# Patient Record
Sex: Male | Born: 1980 | Race: White | Hispanic: Yes | Marital: Single | Smoking: Never smoker
Health system: Southern US, Community
[De-identification: ages and names within clinical notes are randomized; demographics above are authoritative.]

## PROBLEM LIST (undated history)

## (undated) DIAGNOSIS — I10 Essential (primary) hypertension: Secondary | ICD-10-CM

---

## 2011-04-01 ENCOUNTER — Emergency Department (HOSPITAL_COMMUNITY)
Admission: EM | Admit: 2011-04-01 | Discharge: 2011-04-01 | Disposition: A | Discharge status: Home / Self Care | Payer: Self-pay | Attending: Emergency Medicine | Admitting: Emergency Medicine

## 2011-04-01 ENCOUNTER — Emergency Department (HOSPITAL_COMMUNITY): Payer: Self-pay

## 2011-04-01 DIAGNOSIS — I10 Essential (primary) hypertension: Secondary | ICD-10-CM | POA: Insufficient documentation

## 2011-04-01 DIAGNOSIS — R0789 Other chest pain: Secondary | ICD-10-CM | POA: Insufficient documentation

## 2011-04-01 LAB — CBC
Hemoglobin: 15.8 g/dL (ref 13.0–17.0)
MCH: 30.2 pg (ref 26.0–34.0)
MCV: 85.9 fL (ref 78.0–100.0)
RBC: 5.23 MIL/uL (ref 4.22–5.81)

## 2011-04-01 LAB — CK TOTAL AND CKMB (NOT AT ARMC): Relative Index: 2.6 — ABNORMAL HIGH (ref 0.0–2.5)

## 2011-04-01 LAB — DIFFERENTIAL
Basophils Absolute: 0.1 10*3/uL (ref 0.0–0.1)
Eosinophils Absolute: 0.1 10*3/uL (ref 0.0–0.7)
Eosinophils Relative: 2 % (ref 0–5)
Lymphocytes Relative: 29 % (ref 12–46)
Monocytes Absolute: 0.4 10*3/uL (ref 0.1–1.0)

## 2011-04-01 LAB — COMPREHENSIVE METABOLIC PANEL
ALT: 18 U/L (ref 0–53)
CO2: 26 mEq/L (ref 19–32)
Calcium: 8.5 mg/dL (ref 8.4–10.5)
GFR calc Af Amer: >60 mL/min (ref >60)
GFR calc non Af Amer: >60 mL/min (ref >60)
Glucose, Bld: 168 mg/dL — ABNORMAL HIGH (ref 70–99)
Sodium: 139 mEq/L (ref 135–145)
Total Bilirubin: 0.2 mg/dL — ABNORMAL LOW (ref 0.3–1.2)

## 2013-02-03 IMAGING — CR DG CHEST 1V PORT
1 series · 1 of 1 positions shown · non-contrast
Comparison: None.

CLINICAL DATA: Chest pain.  Headache.

PORTABLE CHEST - 1 VIEW

[AP]
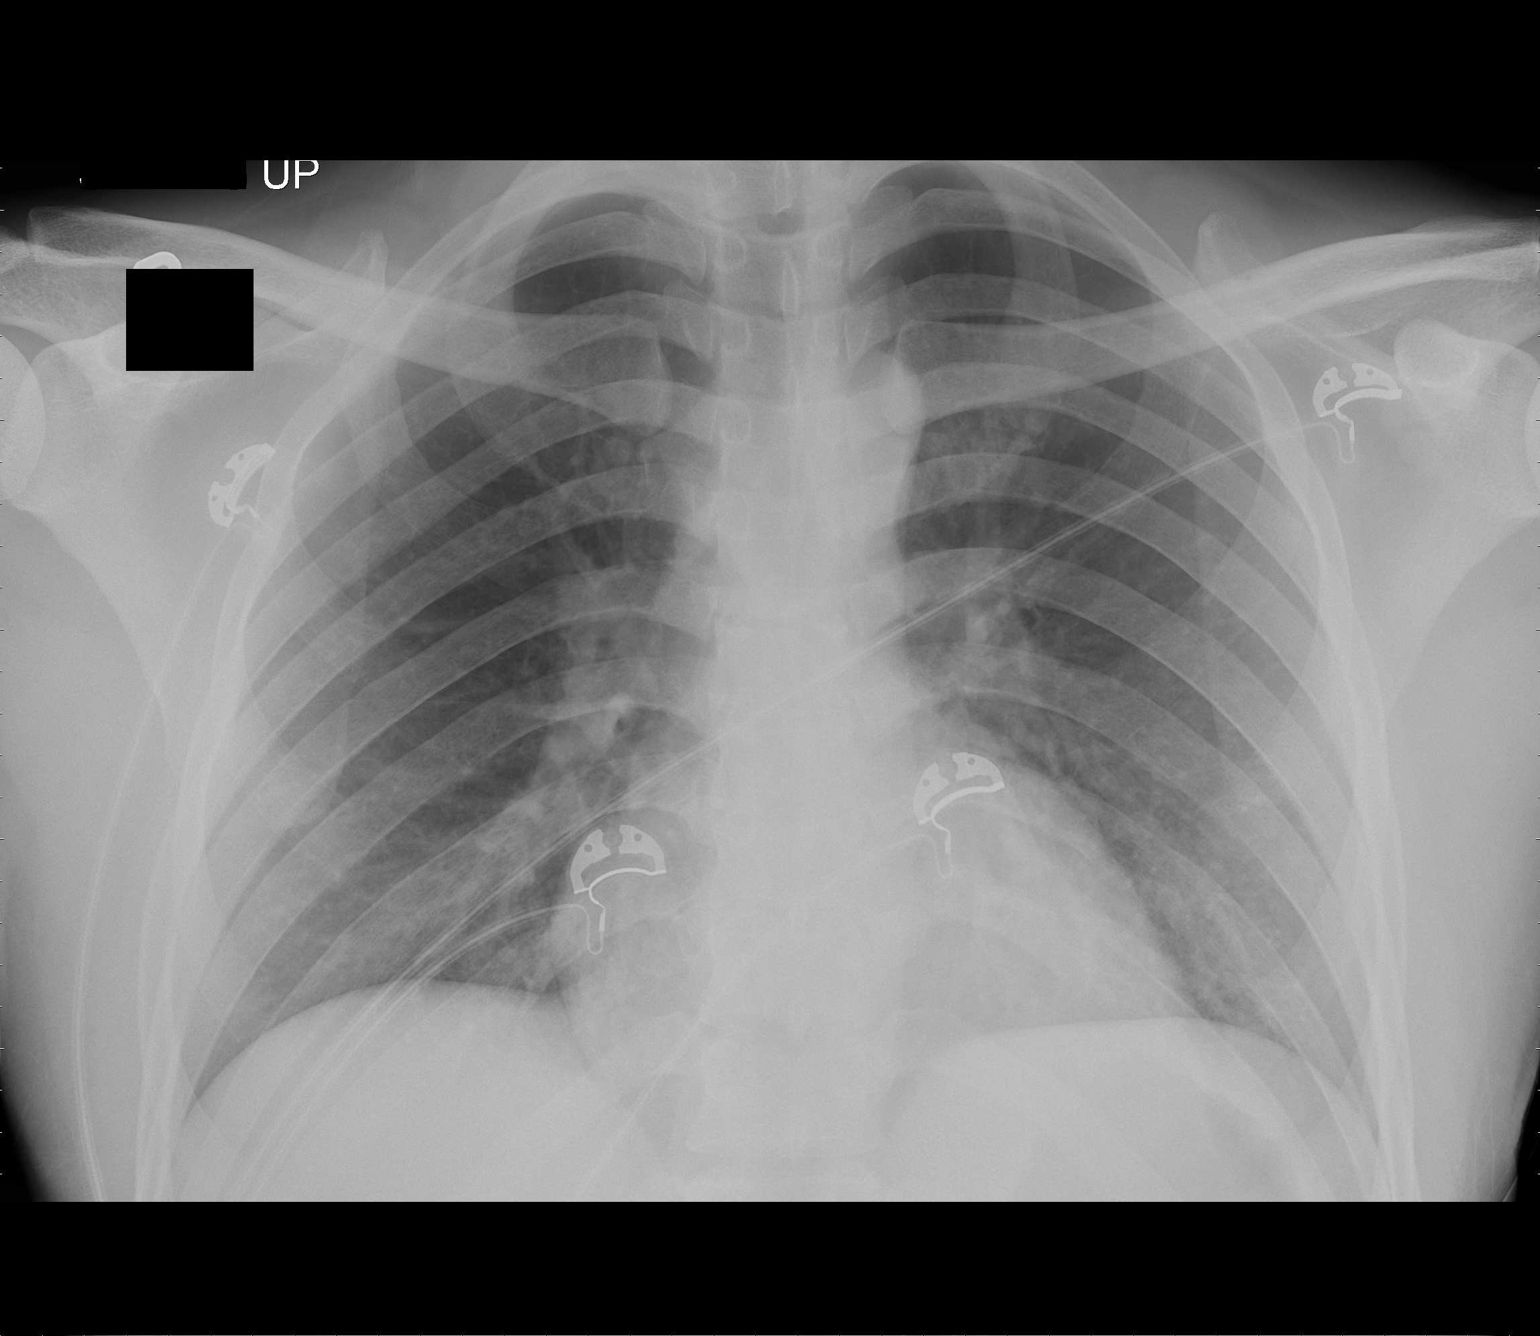

[1 of 1 positions shown; findings below may reference images not displayed]

FINDINGS: Heart size and vascularity are normal and the lungs are
clear.  No osseous abnormality.
IMPRESSION: Normal chest.

## 2017-03-13 ENCOUNTER — Emergency Department (HOSPITAL_COMMUNITY)
Admission: EM | Admit: 2017-03-13 | Discharge: 2017-03-13 | Disposition: A | Discharge status: Home / Self Care | Payer: Self-pay | Attending: Emergency Medicine | Admitting: Emergency Medicine

## 2017-03-13 ENCOUNTER — Encounter (HOSPITAL_COMMUNITY): Payer: Self-pay | Admitting: Neurology

## 2017-03-13 DIAGNOSIS — L0291 Cutaneous abscess, unspecified: Secondary | ICD-10-CM

## 2017-03-13 DIAGNOSIS — W57XXXA Bitten or stung by nonvenomous insect and other nonvenomous arthropods, initial encounter: Secondary | ICD-10-CM | POA: Insufficient documentation

## 2017-03-13 DIAGNOSIS — M71052 Abscess of bursa, left hip: Secondary | ICD-10-CM | POA: Insufficient documentation

## 2017-03-13 HISTORY — DX: Essential (primary) hypertension: I10

## 2017-03-13 MED ORDER — SULFAMETHOXAZOLE-TRIMETHOPRIM 800-160 MG PO TABS: 1.0000 | ORAL_TABLET | Freq: Two times a day (BID) | ORAL | 0 refills | Status: AC

## 2017-03-13 MED ORDER — LIDOCAINE HCL (PF) 1 % IJ SOLN
5.0000 mL | Freq: Once | INTRAMUSCULAR | Status: AC
  Administered 2017-03-13: 5 mL
  Filled 2017-03-13: qty 5

## 2017-03-13 NOTE — ED Provider Notes (Signed)
[facility]-EMERGENCY DEPT Provider Note   By signing my name below, I, Earmon PhoenixJennifer Waddell, attest that this documentation has been prepared under the direction and in the presence of Raeford RazorKohut, Twinkle Sockwell, MD. Electronically Signed: Earmon PhoenixJennifer Waddell, ED Scribe. 03/13/17. 9:52 AM.    History   Chief Complaint Chief Complaint  Patient presents with  . Insect Bite    The history is provided by the patient and medical records. No language interpreter was used.    Steve Sweeney is a 36 y.o. male with PMHx of HTN who presents to the Emergency Department complaining of an insect bite to the left hip that appeared five days ago. He reports associated pain, swelling, drainage and itching. He states he was lying in the grass and believes a spider bit him. He has been doing symptomatic treatment at home with minimal relief. Touching the area increases the pain. He denies alleviating factors. He denies fever, chills, nausea, vomiting. He denies allergies to any medications.   Past Medical History:  Diagnosis Date  . Hypertension     There are no active problems to display for this patient.   History reviewed. No pertinent surgical history.     Home Medications    Prior to Admission medications   Medication Sig Start Date End Date Taking? Authorizing Provider  sulfamethoxazole-trimethoprim (BACTRIM DS,SEPTRA DS) 800-160 MG tablet Take 1 tablet by mouth 2 (two) times daily. 03/13/17 03/20/17  Raeford RazorKohut, Eugenio Dollins, MD    Family History No family history on file.  Social History Social History  Substance Use Topics  . Smoking status: Never Smoker  . Smokeless tobacco: Not on file  . Alcohol use Yes     Allergies   Patient has no known allergies.   Review of Systems Review of Systems All other systems reviewed and are negative for acute change except as noted in the HPI.   Physical Exam Updated Vital Signs BP (!) 139/99 (BP Location: Right Arm)   Pulse 62   Temp 97.6 F (36.4 C)  (Oral)   Resp 16   Ht 5\' 3"  (1.6 m)   Wt 140 lb (63.5 kg)   SpO2 98%   BMI 24.80 kg/m   Physical Exam  Constitutional: He is oriented to person, place, and time. He appears well-developed and well-nourished.  HENT:  Head: Normocephalic.  Eyes: EOM are normal.  Neck: Normal range of motion.  Cardiovascular: Normal rate, regular rhythm and normal heart sounds.   Pulmonary/Chest: Effort normal and breath sounds normal.  Abdominal: Soft. He exhibits no distension. There is no tenderness.  Musculoskeletal: Normal range of motion.  Neurological: He is alert and oriented to person, place, and time.  Skin: Skin is warm and dry.  Left hip with 2 cm lesion consistent with an abscess. Small excoriated area over top. No active drainage. Small area of surrounding cellulitis.  Psychiatric: He has a normal mood and affect.  Nursing note and vitals reviewed.    ED Treatments / Results  DIAGNOSTIC STUDIES: Oxygen Saturation is 98% on RA, normal by my interpretation.   COORDINATION OF CARE: 9:18 AM- Will I & D abscess. Pt verbalizes understanding and agrees to plan.  Medications  lidocaine (PF) (XYLOCAINE) 1 % injection 5 mL (5 mLs Other Given 03/13/17 0936)    Labs (all labs ordered are listed, but only abnormal results are displayed) Labs Reviewed - No data to display  EKG  EKG Interpretation None       Radiology No results found.  Procedures .Marland Kitchen.Incision and  Drainage Date/Time: 03/13/2017 9:42 AM Performed by: Raeford Razor Authorized by: Raeford Razor   Consent:    Consent obtained:  Verbal   Consent given by:  Patient Location:    Type:  Abscess   Location:  Lower extremity   Lower extremity location:  Hip   Hip location:  L hip Pre-procedure details:    Skin preparation:  Betadine Anesthesia (see MAR for exact dosages):    Anesthesia method:  Local infiltration   Local anesthetic:  Lidocaine 1% w/o epi Procedure type:    Complexity:  Complex Procedure  details:    Needle aspiration: no     Incision types:  Single straight   Incision depth:  Subcutaneous   Scalpel blade:  11   Wound management:  Probed and deloculated and irrigated with saline   Drainage:  Purulent   Drainage amount:  Scant   Wound treatment:  Wound left open   Packing materials:  None Post-procedure details:    Patient tolerance of procedure:  Tolerated well, no immediate complications     (including critical care time)  Medications Ordered in ED Medications  lidocaine (PF) (XYLOCAINE) 1 % injection 5 mL (5 mLs Other Given 03/13/17 0936)     Initial Impression / Assessment and Plan / ED Course  I have reviewed the triage vital signs and the nursing notes.  Pertinent labs & imaging results that were available during my care of the patient were reviewed by me and considered in my medical decision making (see chart for details).     36yM with abscess L hip. He thinks may have been bitten. I think it actually was from the tool belt he uses as he says it lays right over this area. I&D'd. abx for cellulitis. I feel appropriate for DC. It has been determined that no acute conditions requiring further emergency intervention are present at this time. The patient has been advised of the diagnosis and plan. I reviewed any labs and imaging including any potential incidental findings. We have discussed signs and symptoms that warrant return to the ED and they are listed in the discharge instructions.    Final Clinical Impressions(s) / ED Diagnoses   Final diagnoses:  Abscess    New Prescriptions New Prescriptions   SULFAMETHOXAZOLE-TRIMETHOPRIM (BACTRIM DS,SEPTRA DS) 800-160 MG TABLET    Take 1 tablet by mouth 2 (two) times daily.    I personally preformed the services scribed in my presence. The recorded information has been reviewed is accurate. Raeford Razor, MD.     Raeford Razor, MD 03/23/17 [phone]

## 2017-03-13 NOTE — ED Triage Notes (Signed)
Pt reports last Friday was lying in the grass and felt something bite him on his left flank. Since then it has been itching and burning. No drainage.
